# Patient Record
Sex: Male | Born: 1995 | Race: White | Hispanic: No | Marital: Single | Smoking: Current some day smoker
Health system: Southern US, Community
[De-identification: ages and names within clinical notes are randomized; demographics above are authoritative.]

## PROBLEM LIST (undated history)

## (undated) DIAGNOSIS — F988 Other specified behavioral and emotional disorders with onset usually occurring in childhood and adolescence: Secondary | ICD-10-CM

---
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
## 2020-09-30 ENCOUNTER — Other Ambulatory Visit (HOSPITAL_BASED_OUTPATIENT_CLINIC_OR_DEPARTMENT_OTHER): Payer: Self-pay | Admitting: Emergency Medicine

## 2020-09-30 ENCOUNTER — Emergency Department (HOSPITAL_BASED_OUTPATIENT_CLINIC_OR_DEPARTMENT_OTHER): Payer: Self-pay

## 2020-09-30 ENCOUNTER — Encounter (HOSPITAL_BASED_OUTPATIENT_CLINIC_OR_DEPARTMENT_OTHER): Payer: Self-pay

## 2020-09-30 ENCOUNTER — Emergency Department (HOSPITAL_BASED_OUTPATIENT_CLINIC_OR_DEPARTMENT_OTHER)
Admission: EM | Admit: 2020-09-30 | Discharge: 2020-09-30 | Disposition: A | Discharge status: Home / Self Care | Payer: Self-pay | Attending: Emergency Medicine | Admitting: Emergency Medicine

## 2020-09-30 ENCOUNTER — Other Ambulatory Visit: Payer: Self-pay

## 2020-09-30 DIAGNOSIS — F17200 Nicotine dependence, unspecified, uncomplicated: Secondary | ICD-10-CM | POA: Insufficient documentation

## 2020-09-30 DIAGNOSIS — X501XXA Overexertion from prolonged static or awkward postures, initial encounter: Secondary | ICD-10-CM | POA: Insufficient documentation

## 2020-09-30 DIAGNOSIS — Y92511 Restaurant or cafe as the place of occurrence of the external cause: Secondary | ICD-10-CM | POA: Insufficient documentation

## 2020-09-30 DIAGNOSIS — M545 Low back pain, unspecified: Secondary | ICD-10-CM

## 2020-09-30 MED ORDER — METHOCARBAMOL 500 MG PO TABS: 500.0000 mg | ORAL_TABLET | Freq: Three times a day (TID) | ORAL | 0 refills | Status: DC | PRN

## 2020-09-30 MED ORDER — IBUPROFEN 800 MG PO TABS: 800.0000 mg | ORAL_TABLET | Freq: Three times a day (TID) | ORAL | 0 refills | Status: DC | PRN

## 2020-09-30 MED FILL — METHOCARBAMOL 500 MG TABS: 500 | 7 days supply | Qty: 20 | Fill #0

## 2020-09-30 MED FILL — IBUPROFEN 800 MG TAB: 800 | 7 days supply | Qty: 21 | Fill #0

## 2020-09-30 NOTE — ED Triage Notes (Signed)
Pt reports lower back pain x 1.5 weeks

## 2020-09-30 NOTE — Discharge Instructions (Signed)
You were seen in the emergency department for evaluation of low back pain.  You had an x-ray that did not show any significant abnormalities.  This is likely muscular and usually will respond to anti-inflammatories and muscle relaxants.  You can continue to use heat as needed along with some gentle stretching.  Follow-up with your doctor.  Return to the emergency department for any worsening or concerning symptoms.

## 2020-09-30 NOTE — ED Provider Notes (Signed)
[facility] EMERGENCY DEPARTMENT Provider Note   CSN: [id number] Arrival date & time: 09/30/20  6962     History Chief Complaint  Patient presents with  . Back Pain    Billy Mathis is a 24 y.o. male.  He has no significant past medical history.  He said he works at [employer] and does a lot of lifting and twisting.  His low back has been hurting sharp stabbing for 1-1/2 weeks.  Tried some ibuprofen without improvement.  Using heating pad.  No radiation down the legs no fever no IV drug use no numbness no weakness.  No abdominal pain or urinary symptoms.  The history is provided by the patient.  Back Pain Location:  Lumbar spine Quality:  Stabbing Radiates to:  Does not radiate Pain severity:  Moderate Onset quality:  Gradual Duration:  2 weeks Timing:  Constant Progression:  Unchanged Chronicity:  New Context: occupational injury   Relieved by:  Nothing Worsened by:  Bending and twisting Ineffective treatments:  Heating pad and ibuprofen Associated symptoms: no abdominal pain, no bladder incontinence, no bowel incontinence, no dysuria, no fever, no numbness and no weakness        History reviewed. No pertinent past medical history.  There are no problems to display for this patient.   History reviewed. No pertinent surgical history.     History reviewed. No pertinent family history.  Social History   Tobacco Use  . Smoking status: Current Some Day Smoker  . Smokeless tobacco: Never Used  Substance Use Topics  . Alcohol use: Not Currently  . Drug use: Not Currently    Home Medications Prior to Admission medications   Not on File    Allergies    Shellfish allergy  Review of Systems   Review of Systems  Constitutional: Negative for fever.  Gastrointestinal: Negative for abdominal pain and bowel incontinence.  Genitourinary: Negative for bladder incontinence and dysuria.  Musculoskeletal: Positive for back pain.  Skin: Negative for rash.    Neurological: Negative for weakness and numbness.    Physical Exam Updated Vital Signs BP (!) 145/102 (BP Location: Right Arm)   Pulse 62   Temp 98 F (36.7 C) (Oral)   Resp 18   Ht 5\' 7"  (1.702 m)   Wt 65.8 kg   SpO2 99%   BMI 22.71 kg/m   Physical Exam Vitals and nursing note reviewed.  Constitutional:      Appearance: Normal appearance. He is well-developed.  HENT:     Head: Normocephalic and atraumatic.  Eyes:     Conjunctiva/sclera: Conjunctivae normal.  Pulmonary:     Effort: Pulmonary effort is normal.  Abdominal:     General: Abdomen is flat.     Tenderness: There is no guarding or rebound.  Musculoskeletal:        General: Tenderness present. No deformity.     Cervical back: Neck supple.     Right lower leg: No edema.     Left lower leg: No edema.     Comments: He has diffuse tenderness lumbar and paralumbar spine.  No step-offs.  No overlying skin changes.  Skin:    General: Skin is warm and dry.     Capillary Refill: Capillary refill takes less than 2 seconds.  Neurological:     General: No focal deficit present.     Mental Status: He is alert.     GCS: GCS eye subscore is 4. GCS verbal subscore is 5. GCS motor subscore  is 6.     Sensory: No sensory deficit.     Motor: No weakness.     Gait: Gait normal.     ED Results / Procedures / Treatments   Labs (all labs ordered are listed, but only abnormal results are displayed) Labs Reviewed - No data to display  EKG None  Radiology DG Lumbar Spine Complete  Result Date: 09/30/2020 CLINICAL DATA:  Low back pain for 1.5 weeks, no trauma EXAM: LUMBAR SPINE - COMPLETE 4+ VIEW COMPARISON:  None. FINDINGS: There is no evidence of lumbar spine fracture. Alignment is normal. Intervertebral disc spaces are maintained. IMPRESSION: No fracture or dislocation of the lumbar spine. Disc spaces and vertebral body heights are preserved. Lumbar disc and neural foraminal pathology may be further evaluated by MRI if  indicated by neurologically localizing signs and symptoms. Electronically Signed   By: Lauralyn Primes M.D.   On: 09/30/2020 10:22    Procedures Procedures (including critical care time)  Medications Ordered in ED Medications - No data to display  ED Course  I have reviewed the triage vital signs and the nursing notes.  Pertinent labs & imaging results that were available during my care of the patient were reviewed by me and considered in my medical decision making (see chart for details).  Clinical Course as of Sep 30 1716  Mon Sep 30, 2020  1024 X-ray does not show any obvious fracture or dislocation, has normal alignment no arthritic changes.   [MB]    Clinical Course User Index [MB] Terrilee Files, MD  Differential diagnosis includes musculoskeletal strain, disc, less likely fracture, arthritis MDM Rules/Calculators/A&P                         Patient with no red flags on hx or exam. They understand the need for further evaluation with their primary care provider and symptoms to watch out for to return to emergency department.   Final Clinical Impression(s) / ED Diagnoses Final diagnoses:  Acute bilateral low back pain without sciatica    Rx / DC Orders ED Discharge Orders         Ordered    ibuprofen (ADVIL) 800 MG tablet  Every 8 hours PRN        09/30/20 1025    methocarbamol (ROBAXIN) 500 MG tablet  Every 8 hours PRN        09/30/20 1025           Terrilee Files, MD 09/30/20 1717

## 2021-02-18 ENCOUNTER — Emergency Department (HOSPITAL_BASED_OUTPATIENT_CLINIC_OR_DEPARTMENT_OTHER)
Admission: EM | Admit: 2021-02-18 | Discharge: 2021-02-18 | Disposition: A | Discharge status: Home / Self Care | Payer: Self-pay | Attending: Emergency Medicine | Admitting: Emergency Medicine

## 2021-02-18 ENCOUNTER — Encounter (HOSPITAL_BASED_OUTPATIENT_CLINIC_OR_DEPARTMENT_OTHER): Payer: Self-pay | Admitting: Emergency Medicine

## 2021-02-18 DIAGNOSIS — F419 Anxiety disorder, unspecified: Secondary | ICD-10-CM | POA: Insufficient documentation

## 2021-02-18 DIAGNOSIS — F1910 Other psychoactive substance abuse, uncomplicated: Secondary | ICD-10-CM | POA: Insufficient documentation

## 2021-02-18 DIAGNOSIS — F17200 Nicotine dependence, unspecified, uncomplicated: Secondary | ICD-10-CM | POA: Insufficient documentation

## 2021-02-18 MED ORDER — HYDROXYZINE HCL 25 MG PO TABS: 25.0000 mg | ORAL_TABLET | Freq: Four times a day (QID) | ORAL | 0 refills | Status: DC

## 2021-02-18 MED ORDER — HYDROXYZINE HCL 25 MG PO TABS
25.0000 mg | ORAL_TABLET | Freq: Once | ORAL | Status: AC
  Administered 2021-02-18: 25 mg via ORAL
  Filled 2021-02-18: qty 1

## 2021-02-18 NOTE — ED Provider Notes (Signed)
[facility] EMERGENCY DEPARTMENT Provider Note   CSN: [id number] Arrival date & time: 02/18/21  8182     History Chief Complaint  Patient presents with  . Anxiety    Billy Mathis is a 25 y.o. male.  Patient admits to abusing marijuana, benzos.  Feeling anxious today.   Denies suicidal homicidal ideation.  The history is provided by the patient.  Anxiety This is a new problem. The problem occurs constantly. Pertinent negatives include no chest pain, no abdominal pain and no shortness of breath. Nothing aggravates the symptoms. Nothing relieves the symptoms. He has tried nothing for the symptoms. The treatment provided no relief.       History reviewed. No pertinent past medical history.  There are no problems to display for this patient.   History reviewed. No pertinent surgical history.     No family history on file.  Social History   Tobacco Use  . Smoking status: Current Some Day Smoker  . Smokeless tobacco: Never Used  Vaping Use  . Vaping Use: Some days  . Devices: THC  Substance Use Topics  . Alcohol use: Not Currently    Home Medications Prior to Admission medications   Medication Sig Start Date End Date Taking? Authorizing Provider  hydrOXYzine (ATARAX/VISTARIL) 25 MG tablet Take 1 tablet (25 mg total) by mouth every 6 (six) hours. 02/18/21  Yes Fedra Lanter, DO  ibuprofen (ADVIL) 800 MG tablet Take 1 tablet (800 mg total) by mouth every 8 (eight) hours as needed. 09/30/20   Terrilee Files, MD  methocarbamol (ROBAXIN) 500 MG tablet Take 1 tablet (500 mg total) by mouth every 8 (eight) hours as needed for muscle spasms. 09/30/20   Terrilee Files, MD    Allergies    Shellfish allergy  Review of Systems   Review of Systems  Constitutional: Negative for chills and fever.  HENT: Negative for ear pain and sore throat.   Eyes: Negative for pain and visual disturbance.  Respiratory: Negative for cough and shortness of breath.    Cardiovascular: Negative for chest pain and palpitations.  Gastrointestinal: Negative for abdominal pain and vomiting.  Genitourinary: Negative for dysuria and hematuria.  Musculoskeletal: Negative for arthralgias and back pain.  Skin: Negative for color change and rash.  Neurological: Negative for seizures and syncope.  Psychiatric/Behavioral: The patient is nervous/anxious.   All other systems reviewed and are negative.   Physical Exam Updated Vital Signs BP 124/85 (BP Location: Right Arm)   Pulse 68   Temp 97.7 F (36.5 C) (Oral)   Resp 18   Ht 5\' 7"  (1.702 m)   Wt 69 kg   SpO2 100%   BMI 23.82 kg/m   Physical Exam Vitals and nursing note reviewed.  Constitutional:      Appearance: He is well-developed.  HENT:     Head: Normocephalic and atraumatic.  Eyes:     Extraocular Movements: Extraocular movements intact.     Conjunctiva/sclera: Conjunctivae normal.     Pupils: Pupils are equal, round, and reactive to light.  Cardiovascular:     Rate and Rhythm: Normal rate and regular rhythm.     Pulses: Normal pulses.     Heart sounds: No murmur heard.   Pulmonary:     Effort: Pulmonary effort is normal. No respiratory distress.     Breath sounds: Normal breath sounds.  Abdominal:     Palpations: Abdomen is soft.     Tenderness: There is no abdominal tenderness.  Musculoskeletal:  Cervical back: Neck supple.  Skin:    General: Skin is warm and dry.  Neurological:     General: No focal deficit present.     Mental Status: He is alert and oriented to person, place, and time.     Cranial Nerves: No cranial nerve deficit.     Sensory: No sensory deficit.     Motor: No weakness.     Coordination: Coordination normal.     ED Results / Procedures / Treatments   Labs (all labs ordered are listed, but only abnormal results are displayed) Labs Reviewed - No data to display  EKG None  Radiology No results found.  Procedures Procedures   Medications Ordered  in ED Medications  hydrOXYzine (ATARAX/VISTARIL) tablet 25 mg (25 mg Oral Given 02/18/21 2353)    ED Course  I have reviewed the triage vital signs and the nursing notes.  Pertinent labs & imaging results that were available during my care of the patient were reviewed by me and considered in my medical decision making (see chart for details).    MDM Rules/Calculators/A&P                          Billy Mathis is here with anxiety after using drugs.  Normal vitals.  No fever.  Not suicidal homicidal.  Abusing THC, benzodiazepines.  Overall appears well.  Given Vistaril.  Neurologically he is intact.  Educated about drug use.  Discharged in good condition.  This chart was dictated using voice recognition software.  Despite best efforts to proofread,  errors can occur which can change the documentation meaning.    Final Clinical Impression(s) / ED Diagnoses Final diagnoses:  Anxiety  Polysubstance abuse (HCC)    Rx / DC Orders ED Discharge Orders         Ordered    hydrOXYzine (ATARAX/VISTARIL) 25 MG tablet  Every 6 hours        02/18/21 0745           Virgina Norfolk, DO 02/18/21 1028

## 2021-02-18 NOTE — ED Triage Notes (Signed)
Pt brought in by EMS  States he has been feeling anxious for a couple days now  Pt states he has been using drug he usually does not use  Pt states he smokes weed frequently Pt states he had one episode of nausea and vomiting this morning when he woke up

## 2021-02-18 NOTE — ED Triage Notes (Signed)
Brought by ems from home.   Appears very anxious in triage.  Reports after using a THC vape pen last night felt sob, vomited, and passed out.  Also endorses using adderal, cocaine, and xanax recently.  Reports last dose a few days ago.

## 2021-02-18 NOTE — Discharge Instructions (Addendum)
Follow-up with outpatient psychiatric counseling as well as consider going to rehab.  Is important for you to stop using drugs as this is causing you to have your anxious feelings.  Please return if you develop any worsening feelings of depression that are leading you to want to hurt yourself or hurt others.

## 2022-01-20 ENCOUNTER — Encounter (HOSPITAL_BASED_OUTPATIENT_CLINIC_OR_DEPARTMENT_OTHER): Payer: Self-pay

## 2022-01-20 ENCOUNTER — Other Ambulatory Visit: Payer: Self-pay

## 2022-01-20 ENCOUNTER — Emergency Department (HOSPITAL_BASED_OUTPATIENT_CLINIC_OR_DEPARTMENT_OTHER)
Admission: EM | Admit: 2022-01-20 | Discharge: 2022-01-20 | Disposition: A | Discharge status: Home / Self Care | Payer: Self-pay | Attending: Emergency Medicine | Admitting: Emergency Medicine

## 2022-01-20 DIAGNOSIS — M5432 Sciatica, left side: Secondary | ICD-10-CM | POA: Insufficient documentation

## 2022-01-20 DIAGNOSIS — X500XXA Overexertion from strenuous movement or load, initial encounter: Secondary | ICD-10-CM | POA: Insufficient documentation

## 2022-01-20 HISTORY — DX: Other specified behavioral and emotional disorders with onset usually occurring in childhood and adolescence: F98.8

## 2022-01-20 MED ORDER — KETOROLAC TROMETHAMINE 60 MG/2ML IM SOLN
60.0000 mg | Freq: Once | INTRAMUSCULAR | Status: AC
  Administered 2022-01-20: 60 mg via INTRAMUSCULAR
  Filled 2022-01-20: qty 2

## 2022-01-20 MED ORDER — DEXAMETHASONE SODIUM PHOSPHATE 10 MG/ML IJ SOLN
10.0000 mg | Freq: Once | INTRAMUSCULAR | Status: AC
  Administered 2022-01-20: 10 mg via INTRAMUSCULAR
  Filled 2022-01-20: qty 1

## 2022-01-20 MED ORDER — METHYLPREDNISOLONE 4 MG PO TBPK: ORAL_TABLET | ORAL | 0 refills | Status: AC

## 2022-01-20 MED ORDER — CYCLOBENZAPRINE HCL 10 MG PO TABS: 5.0000 mg | ORAL_TABLET | Freq: Two times a day (BID) | ORAL | 0 refills | Status: AC | PRN

## 2022-01-20 MED ORDER — CELECOXIB 200 MG PO CAPS: 200.0000 mg | ORAL_CAPSULE | Freq: Two times a day (BID) | ORAL | 0 refills | Status: AC

## 2022-01-20 NOTE — ED Triage Notes (Signed)
Pt reports lower back pain that radiates down his left leg, onset 1 week ago when he woke up.

## 2022-01-20 NOTE — ED Notes (Signed)
Pt in bed, pt reports decreased pain, pt states that he is ready to go home, pt verbalized understanding d/c instructions and follow up,ambulatory from dpt with sig other.

## 2022-01-20 NOTE — ED Provider Notes (Signed)
[facility] EMERGENCY DEPARTMENT Provider Note   CSN: [id number] Arrival date & time: 01/20/22  [id number]     History  Chief Complaint  Patient presents with   Back Pain    Billy Mathis is a 26 y.o. male.   Back Pain Location:  Lumbar spine Quality:  Aching Radiates to:  L posterior upper leg Pain severity:  Severe Pain is:  Same all the time Onset quality:  Gradual Duration:  1 week Timing:  Constant Progression:  Unchanged Chronicity:  New Context: lifting heavy objects   Relieved by:  Nothing Worsened by:  Bending and sitting Associated symptoms: leg pain   Associated symptoms: no abdominal pain, no abdominal swelling, no bladder incontinence, no bowel incontinence, no chest pain, no dysuria, no fever, no headaches, no numbness, no paresthesias, no pelvic pain, no perianal numbness, no tingling and no weakness       Home Medications Prior to Admission medications   Medication Sig Start Date End Date Taking? Authorizing Provider  celecoxib (CELEBREX) 200 MG capsule Take 1 capsule (200 mg total) by mouth 2 (two) times daily. 01/20/22  Yes Evlyn Amason, PA-C  cyclobenzaprine (FLEXERIL) 10 MG tablet Take 0.5-1 tablets (5-10 mg total) by mouth 2 (two) times daily as needed for muscle spasms. 01/20/22  Yes Margarita Mail, PA-C  methylPREDNISolone (MEDROL DOSEPAK) 4 MG TBPK tablet Use as directed 01/20/22  Yes Natoria Archibald, PA-C  hydrOXYzine (ATARAX/VISTARIL) 25 MG tablet Take 1 tablet (25 mg total) by mouth every 6 (six) hours. 02/18/21   Curatolo, Adam, DO      Allergies    Shellfish allergy    Review of Systems   Review of Systems  Constitutional:  Negative for fever.  Cardiovascular:  Negative for chest pain.  Gastrointestinal:  Negative for abdominal pain and bowel incontinence.  Genitourinary:  Negative for bladder incontinence, dysuria and pelvic pain.  Musculoskeletal:  Positive for back pain.  Neurological:  Negative for tingling, weakness, numbness,  headaches and paresthesias.   Physical Exam Updated Vital Signs BP (!) 142/89 (BP Location: Right Arm)    Pulse (!) 107    Temp 98.5 F (36.9 C) (Oral)    Resp 18    Ht 5\' 7"  (1.702 m)    Wt 55.7 kg    SpO2 97%    BMI 19.23 kg/m  Physical Exam Vitals and nursing note reviewed.  Constitutional:      General: He is not in acute distress.    Appearance: He is well-developed. He is not diaphoretic.  HENT:     Head: Normocephalic and atraumatic.  Eyes:     General: No scleral icterus.    Conjunctiva/sclera: Conjunctivae normal.  Cardiovascular:     Rate and Rhythm: Normal rate and regular rhythm.     Heart sounds: Normal heart sounds.  Pulmonary:     Effort: Pulmonary effort is normal. No respiratory distress.     Breath sounds: Normal breath sounds.  Abdominal:     Palpations: Abdomen is soft.     Tenderness: There is no abdominal tenderness.  Musculoskeletal:     Cervical back: Normal range of motion and neck supple.     Comments: Patient appears to be in mild to moderate pain, antalgic gait noted. Lumbosacral spine area reveals no local tenderness or mass. Painful and reduced LS ROM noted. Straight leg raise is positive at 50 degrees on left. DTR's, motor strength and sensation normal, including heel and toe gait.  Peripheral pulses are palpable.  Skin:    General: Skin is warm and dry.  Neurological:     Mental Status: He is alert.  Psychiatric:        Behavior: Behavior normal.    ED Results / Procedures / Treatments   Labs (all labs ordered are listed, but only abnormal results are displayed) Labs Reviewed - No data to display  EKG None  Radiology No results found.  Procedures Procedures    Medications Ordered in ED Medications  dexamethasone (DECADRON) injection 10 mg (has no administration in time range)  ketorolac (TORADOL) injection 60 mg (has no administration in time range)    ED Course/ Medical Decision Making/ A&P                            Medical Decision Making Patient with back pain. + straight leg test. Recent heavy lifting. Consistent with dx Left Sciatica. Tx w/ decadron and toradol. No neurological deficits and normal neuro exam.  Patient can walk but states is painful.  No loss of bowel or bladder control.  No concern for cauda equina.  No fever, night sweats, weight loss, h/o cancer, IVDU.  RICE protocol and pain medicine indicated and discussed with patient.    Problems Addressed: Sciatica of left side: acute illness or injury  Risk Prescription drug management.    Final Clinical Impression(s) / ED Diagnoses Final diagnoses:  Sciatica of left side    Rx / DC Orders ED Discharge Orders          Ordered    methylPREDNISolone (MEDROL DOSEPAK) 4 MG TBPK tablet        01/20/22 1724    celecoxib (CELEBREX) 200 MG capsule  2 times daily        01/20/22 1724    cyclobenzaprine (FLEXERIL) 10 MG tablet  2 times daily PRN        01/20/22 1724              Margarita Mail, PA-C 01/20/22 1725    Gareth Morgan, MD 01/21/22 1446

## 2022-01-20 NOTE — Discharge Instructions (Signed)
Get help right away if: You are not able to control when you urinate or have bowel movements (incontinence). You have: Weakness in your lower back, pelvis, buttocks, or legs that gets worse. Redness or swelling of your back. A burning sensation when you urinate. 

## 2022-06-01 IMAGING — CR DG LUMBAR SPINE COMPLETE 4+V
5 series · 5 of 5 positions shown · non-contrast
Comparison: None.

CLINICAL DATA: Low back pain for 1.5 weeks, no trauma

EXAM:
LUMBAR SPINE - COMPLETE 4+ VIEW

[t l-spine a.p.]
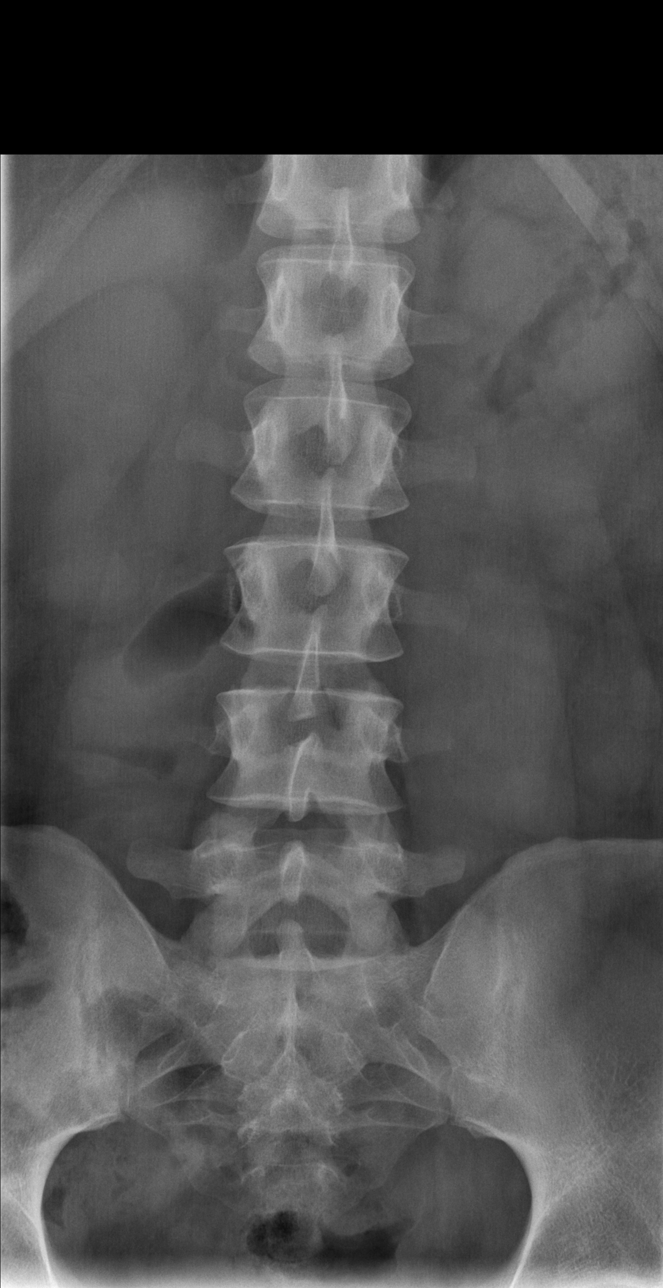

[t l-spine oblique exposure (1 of 2)]
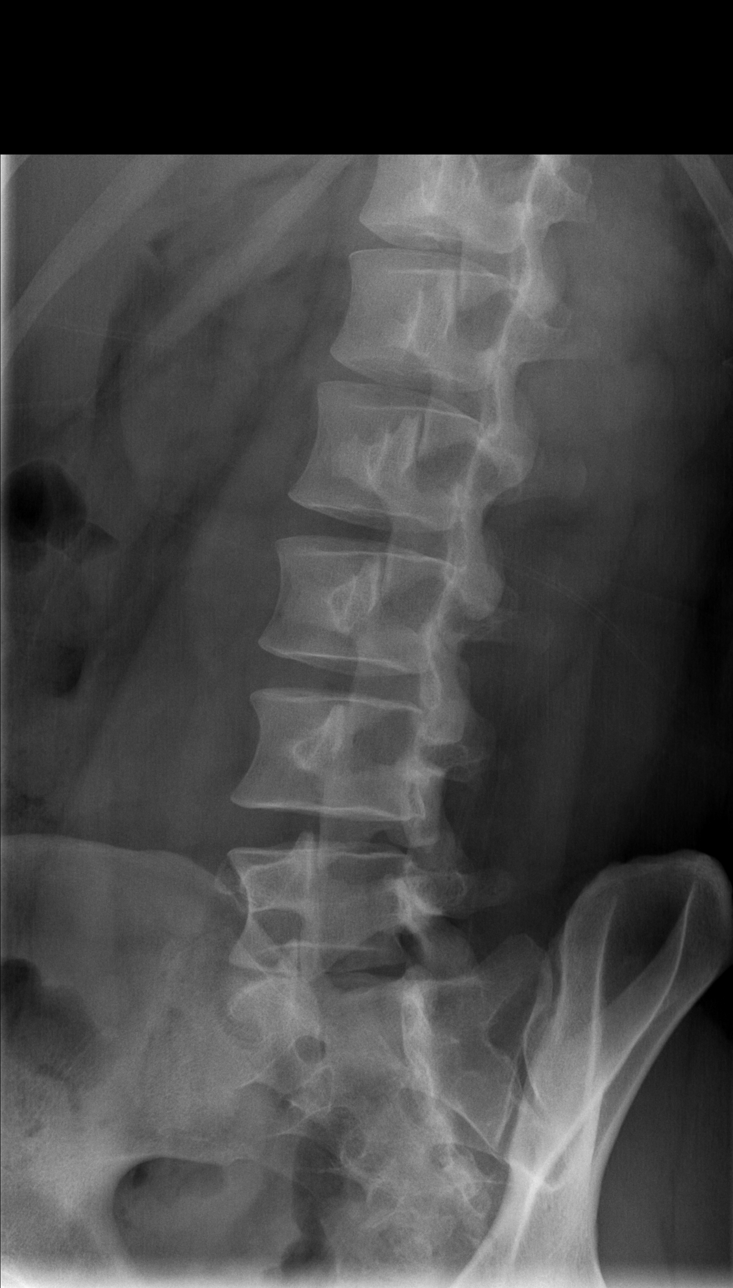

[t l-spine oblique exposure (2 of 2)]
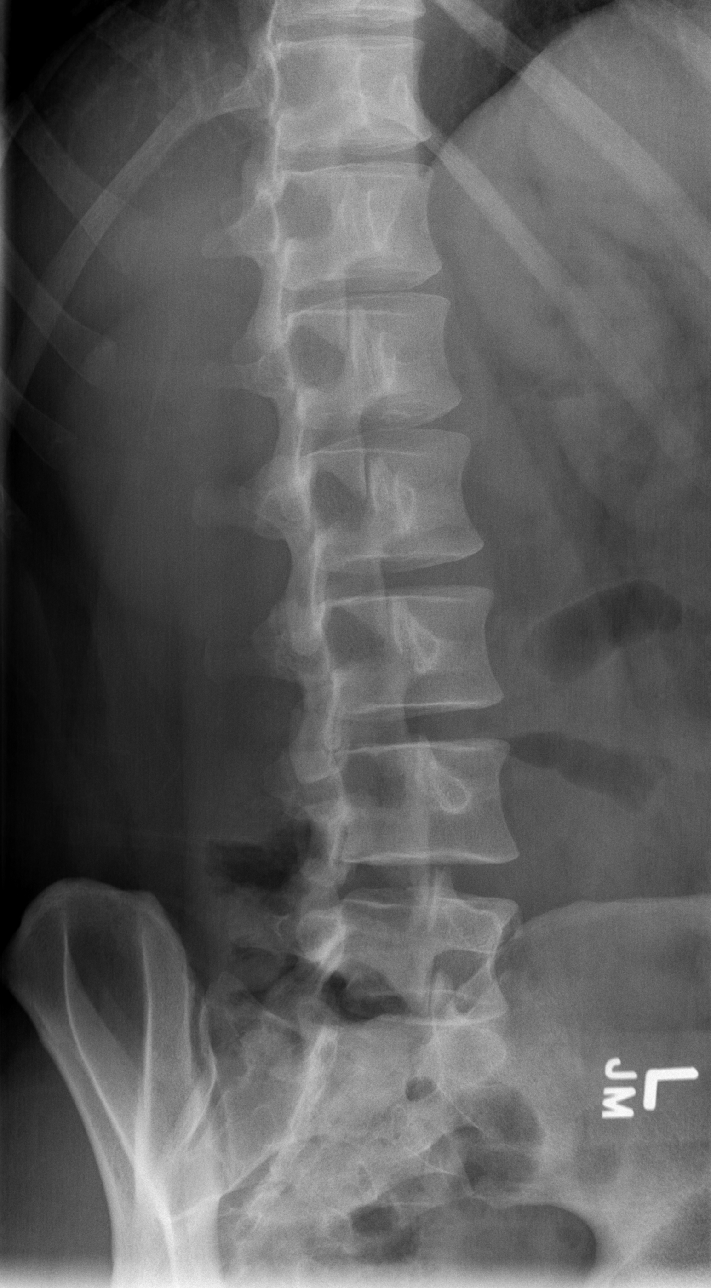

[t l-spine lat]
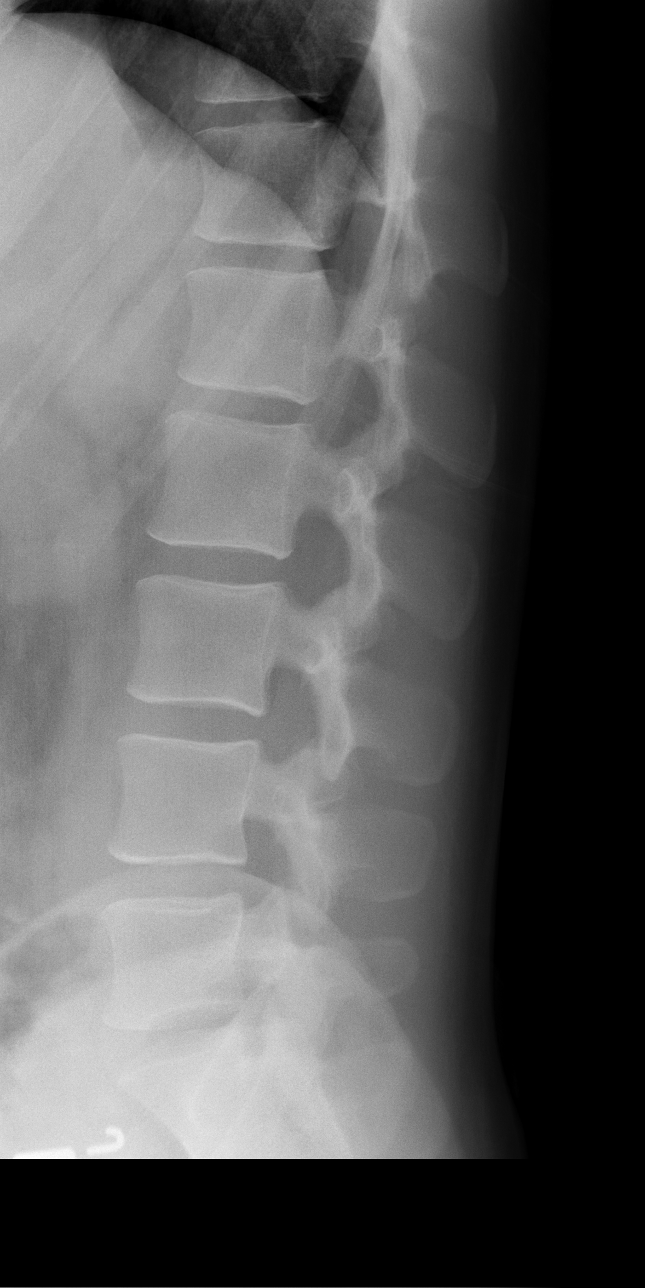

[t l-spine l5-s1 spot]
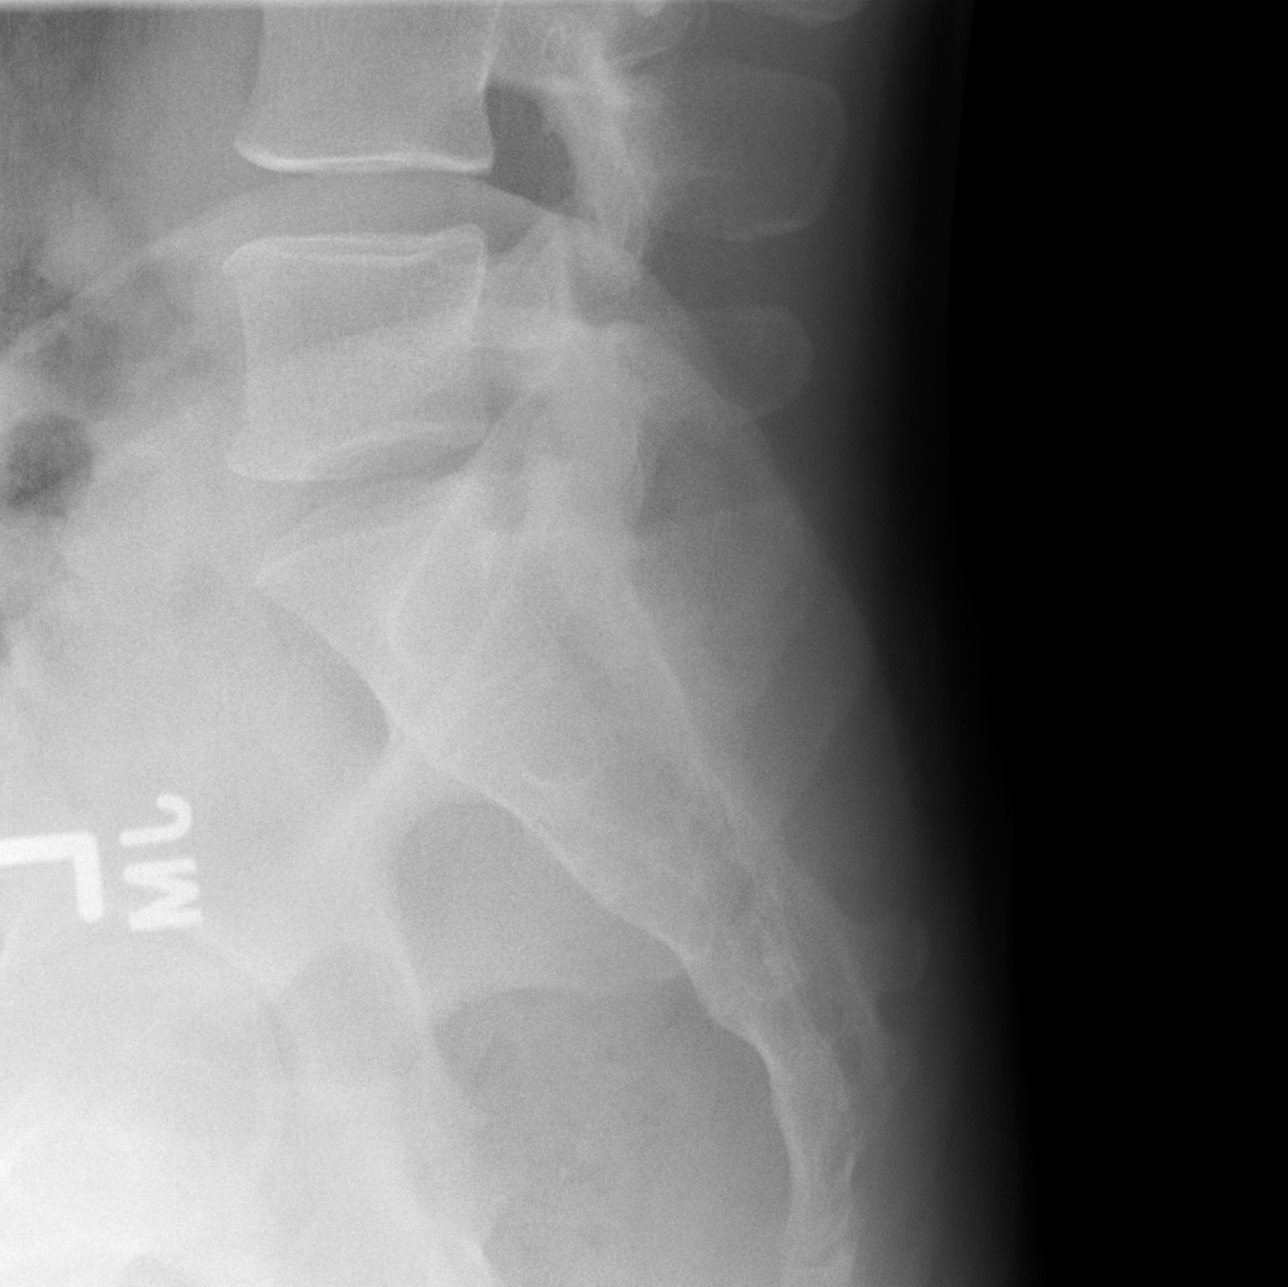

[5 of 5 positions shown; findings below may reference images not displayed]

FINDINGS: There is no evidence of lumbar spine fracture. Alignment is normal.
Intervertebral disc spaces are maintained.
IMPRESSION: No fracture or dislocation of the lumbar spine. Disc spaces and
vertebral body heights are preserved. Lumbar disc and neural
foraminal pathology may be further evaluated by MRI if indicated by
neurologically localizing signs and symptoms.

## 2024-03-01 ENCOUNTER — Emergency Department (HOSPITAL_COMMUNITY)
Admission: EM | Admit: 2024-03-01 | Discharge: 2024-03-01 | Discharge status: Against Medical Advice | Payer: MEDICAID | Attending: Emergency Medicine | Admitting: Emergency Medicine

## 2024-03-01 ENCOUNTER — Encounter (HOSPITAL_COMMUNITY): Payer: Self-pay

## 2024-03-01 ENCOUNTER — Other Ambulatory Visit: Payer: Self-pay

## 2024-03-01 DIAGNOSIS — Z5329 Procedure and treatment not carried out because of patient's decision for other reasons: Secondary | ICD-10-CM | POA: Diagnosis not present

## 2024-03-01 DIAGNOSIS — E162 Hypoglycemia, unspecified: Secondary | ICD-10-CM | POA: Insufficient documentation

## 2024-03-01 DIAGNOSIS — R112 Nausea with vomiting, unspecified: Secondary | ICD-10-CM | POA: Insufficient documentation

## 2024-03-01 DIAGNOSIS — R1084 Generalized abdominal pain: Secondary | ICD-10-CM | POA: Diagnosis not present

## 2024-03-01 LAB — CBC
HCT: 54.9 % — ABNORMAL HIGH (ref 39.0–52.0)
Hemoglobin: 18.6 g/dL — ABNORMAL HIGH (ref 13.0–17.0)
MCH: 29.8 pg (ref 26.0–34.0)
MCHC: 33.9 g/dL (ref 30.0–36.0)
MCV: 88 fL (ref 80.0–100.0)
Platelets: 254 10*3/uL (ref 150–400)
RBC: 6.24 MIL/uL — ABNORMAL HIGH (ref 4.22–5.81)
RDW: 12.7 % (ref 11.5–15.5)
WBC: 13.8 10*3/uL — ABNORMAL HIGH (ref 4.0–10.5)
nRBC: 0 % (ref 0.0–0.2)

## 2024-03-01 LAB — URINALYSIS, ROUTINE W REFLEX MICROSCOPIC
Bacteria, UA: NONE SEEN
Bilirubin Urine: NEGATIVE
Glucose, UA: >=500 mg/dL — AB
Hgb urine dipstick: NEGATIVE
Ketones, ur: 80 mg/dL — AB
Leukocytes,Ua: NEGATIVE
Nitrite: NEGATIVE
Protein, ur: NEGATIVE mg/dL
Specific Gravity, Urine: 1.024 (ref 1.005–1.030)
pH: 5 (ref 5.0–8.0)

## 2024-03-01 LAB — CBG MONITORING, ED
Glucose-Capillary: 61 mg/dL — ABNORMAL LOW (ref 70–99)
Glucose-Capillary: 46 mg/dL — ABNORMAL LOW (ref 70–99)
Glucose-Capillary: 206 mg/dL — ABNORMAL HIGH (ref 70–99)
Glucose-Capillary: 81 mg/dL (ref 70–99)

## 2024-03-01 LAB — COMPREHENSIVE METABOLIC PANEL WITH GFR
ALT: 36 U/L (ref 0–44)
AST: 34 U/L (ref 15–41)
Albumin: 5.6 g/dL — ABNORMAL HIGH (ref 3.5–5.0)
Alkaline Phosphatase: 64 U/L (ref 38–126)
Anion gap: 15 (ref 5–15)
BUN: 20 mg/dL (ref 6–20)
CO2: 23 mmol/L (ref 22–32)
Calcium: 9.9 mg/dL (ref 8.9–10.3)
Chloride: 102 mmol/L (ref 98–111)
Creatinine, Ser: 1.11 mg/dL (ref 0.61–1.24)
GFR, Estimated: >60 mL/min (ref >60)
Glucose, Bld: 89 mg/dL (ref 70–99)
Potassium: 4.7 mmol/L (ref 3.5–5.1)
Sodium: 140 mmol/L (ref 135–145)
Total Bilirubin: 1.9 mg/dL — ABNORMAL HIGH (ref 0.0–1.2)
Total Protein: 8.8 g/dL — ABNORMAL HIGH (ref 6.5–8.1)

## 2024-03-01 LAB — RAPID URINE DRUG SCREEN, HOSP PERFORMED
Amphetamines: NOT DETECTED
Barbiturates: NOT DETECTED
Benzodiazepines: NOT DETECTED
Cocaine: NOT DETECTED
Opiates: NOT DETECTED
Tetrahydrocannabinol: POSITIVE — AB

## 2024-03-01 LAB — LIPASE, BLOOD: Lipase: 23 U/L (ref 11–51)

## 2024-03-01 MED ORDER — SODIUM CHLORIDE 0.9 % IV SOLN: 250.0000 mL | INTRAVENOUS | Status: DC | PRN

## 2024-03-01 MED ORDER — GLUCOSE 40 % PO GEL
1.0000 | Freq: Once | ORAL | Status: AC
  Administered 2024-03-01: 31 g via ORAL
  Filled 2024-03-01: qty 1

## 2024-03-01 MED ORDER — GLUCOSE 4 G PO CHEW
CHEWABLE_TABLET | ORAL | Status: AC
  Filled 2024-03-01: qty 1

## 2024-03-01 MED ORDER — SODIUM CHLORIDE 0.9 % IV BOLUS: 500.0000 mL | Freq: Once | INTRAVENOUS | Status: DC

## 2024-03-01 MED ORDER — DEXTROSE 50 % IV SOLN
INTRAVENOUS | Status: AC
  Administered 2024-03-01: 1 via INTRAVENOUS
  Filled 2024-03-01: qty 50

## 2024-03-01 MED ORDER — SODIUM CHLORIDE 0.9% FLUSH: 3.0000 mL | INTRAVENOUS | Status: DC | PRN

## 2024-03-01 MED ORDER — ONDANSETRON HCL 4 MG/2ML IJ SOLN: 4.0000 mg | Freq: Once | INTRAMUSCULAR | Status: DC

## 2024-03-01 MED ORDER — SODIUM CHLORIDE 0.9% FLUSH
3.0000 mL | Freq: Two times a day (BID) | INTRAVENOUS | Status: DC
  Administered 2024-03-01: 3 mL via INTRAVENOUS

## 2024-03-01 MED ORDER — ONDANSETRON HCL 4 MG PO TABS
4.0000 mg | ORAL_TABLET | Freq: Four times a day (QID) | ORAL | 0 refills | Status: AC
  Filled 2024-03-01: qty 12, 3d supply, fill #0

## 2024-03-01 NOTE — ED Notes (Signed)
 Attempt to draw labs on patient. Pt is refusing at this time. States he needs time to think about it. Pt also refusing IM Glucagon at this time. PA brooke ask to evaluate, agrees. At bedside at this time.

## 2024-03-01 NOTE — ED Notes (Signed)
 Pt given orange juice.

## 2024-03-01 NOTE — ED Provider Triage Note (Cosign Needed)
 Emergency Medicine Provider Triage Evaluation Note  Billy Mathis , a 28 y.o. male  was evaluated in triage.  Pt complains of n/v and chills. C/o generalized abd pain no IVDU.   Review of Systems  Positive: Nausea, vomiting Negative: fevers  Physical Exam  BP 116/69 (BP Location: Left Arm)   Pulse 68   Temp 97.6 F (36.4 C) (Oral)   Resp 16   Ht 5\' 7"  (1.702 m)   Wt 55.3 kg   SpO2 98%   BMI 19.11 kg/m  Gen:   Awake, no distress  Resp:  Normal effort  MSK:   Moves extremities without difficulty  Other:    Medical Decision Making  Medically screening exam initiated at 1:36 PM.  Appropriate orders placed.  Hymen Arnett was informed that the remainder of the evaluation will be completed by another provider, this initial triage assessment does not replace that evaluation, and the importance of remaining in the ED until their evaluation is complete.     Pete Pelt, [place] 03/01/24 1400

## 2024-03-01 NOTE — ED Provider Notes (Cosign Needed)
 [place] EMERGENCY DEPARTMENT AT [facility] Provider Note   CSN: [id number] Arrival date & time: 03/01/24  1251     History  Chief Complaint  Patient presents with   Nausea    Billy Mathis is a 28 y.o. male, no pertinent past medical history, who presents to the ED secondary to nausea/vomiting starting today.  He states he has been vomiting and having some stomach pain, for the last day.  He states pain is all over, and he is requesting food.  States he cannot keep anything down though.  Notes that he has not had any fevers or chills but has had a runny nose.  Denies any sick contacts.  Denies any IV drug use, or alcohol use other than drinking 3 beers last night.  Does use PCP from time to time.  Home Medications Prior to Admission medications   Medication Sig Start Date End Date Taking? Authorizing Provider  celecoxib (CELEBREX) 200 MG capsule Take 1 capsule (200 mg total) by mouth 2 (two) times daily. 01/20/22   Arthor Captain, PA-C  cyclobenzaprine (FLEXERIL) 10 MG tablet Take 0.5-1 tablets (5-10 mg total) by mouth 2 (two) times daily as needed for muscle spasms. 01/20/22   Arthor Captain, PA-C  hydrOXYzine (ATARAX/VISTARIL) 25 MG tablet Take 1 tablet (25 mg total) by mouth every 6 (six) hours. 02/18/21   Virgina Norfolk, DO  methylPREDNISolone (MEDROL DOSEPAK) 4 MG TBPK tablet Use as directed 01/20/22   Arthor Captain, PA-C      Allergies    Shellfish allergy    Review of Systems   Review of Systems  Constitutional:  Negative for fever.  Gastrointestinal:  Positive for abdominal pain, nausea and vomiting.    Physical Exam Updated Vital Signs BP 116/69 (BP Location: Left Arm)   Pulse 68   Temp 97.6 F (36.4 C) (Oral)   Resp 16   Ht 5\' 7"  (1.702 m)   Wt 55.3 kg   SpO2 98%   BMI 19.11 kg/m  Physical Exam Vitals and nursing note reviewed.  Constitutional:      General: He is not in acute distress.    Appearance: He is well-developed.  HENT:     Head:  Normocephalic and atraumatic.  Eyes:     Conjunctiva/sclera: Conjunctivae normal.  Cardiovascular:     Rate and Rhythm: Normal rate and regular rhythm.     Heart sounds: No murmur heard. Pulmonary:     Effort: Pulmonary effort is normal. No respiratory distress.     Breath sounds: Normal breath sounds.  Abdominal:     Palpations: Abdomen is soft.     Tenderness: There is generalized abdominal tenderness. There is no guarding or rebound.  Musculoskeletal:        General: No swelling.     Cervical back: Neck supple.  Skin:    General: Skin is warm and dry.     Capillary Refill: Capillary refill takes less than 2 seconds.  Neurological:     Mental Status: He is alert.  Psychiatric:        Mood and Affect: Mood normal.     ED Results / Procedures / Treatments   Labs (all labs ordered are listed, but only abnormal results are displayed) Labs Reviewed  CBC - Abnormal; Notable for the following components:      Result Value   WBC 13.8 (*)    RBC 6.24 (*)    Hemoglobin 18.6 (*)    HCT 54.9 (*)  All other components within normal limits  CBG MONITORING, ED - Abnormal; Notable for the following components:   Glucose-Capillary 61 (*)    All other components within normal limits  CBG MONITORING, ED - Abnormal; Notable for the following components:   Glucose-Capillary 46 (*)    All other components within normal limits  CBG MONITORING, ED - Abnormal; Notable for the following components:   Glucose-Capillary 206 (*)    All other components within normal limits  RESP PANEL BY RT-PCR (RSV, FLU A&B, COVID)  RVPGX2  LIPASE, BLOOD  COMPREHENSIVE METABOLIC PANEL WITH GFR  URINALYSIS, ROUTINE W REFLEX MICROSCOPIC  RAPID URINE DRUG SCREEN, HOSP PERFORMED  CBG MONITORING, ED  CBG MONITORING, ED    EKG None  Radiology No results found.  Procedures Procedures    Medications Ordered in ED Medications  glucose 4 GM chewable tablet (  Not Given 03/01/24 1428)  sodium chloride  flush (NS) 0.9 % injection 3 mL (3 mLs Intravenous Given 03/01/24 1428)  sodium chloride flush (NS) 0.9 % injection 3 mL (has no administration in time range)  0.9 %  sodium chloride infusion (has no administration in time range)  dextrose (GLUTOSE) oral gel 40% (peds > 20kg and adults) (31 g Oral Given 03/01/24 1335)  dextrose 50 % solution (1 ampule Intravenous Given 03/01/24 1423)    ED Course/ Medical Decision Making/ A&P                                 Medical Decision Making I initially saw this patient in triage, he complains of nausea, vomiting, for the last day, with runny nose as well COVID/flu, lipase, urinalysis ordered, states he does not drink daily, but drinks 3 beers last night.  He was hypoglycemic in triage with a glucose of 61, I ordered oral glucose gel, as he refused an IV initially.  I was not alerted of this, but patient refused oral glucose gel, and was given juice instead.  He came back to the back, and was hypoglycemic even more.  At 41.  I ordered an amp of D50, and recheck in 15 minutes.  He is mentating well,'s tender generalized abdomen without any guarding.  Has not eaten or drank in a while he states.  Will obtain labs for further evaluation.  Continue to monitor sugar. Handed off to Metaline PA to follow-up on results  Amount and/or Complexity of Data Reviewed Labs: ordered.  Risk OTC drugs. Prescription drug management.    Final Clinical Impression(s) / ED Diagnoses Final diagnoses:  Hypoglycemia  Nausea and vomiting, unspecified vomiting type  Generalized abdominal pain    Rx / DC Orders ED Discharge Orders     None         Shamere Campas L, PA 03/01/24 1505

## 2024-03-01 NOTE — ED Provider Notes (Signed)
 Patient given in sign out by Small, PA-C.  Please review their note for patient HPI, physical exam, workup.  At this time the plan is to reevaluate after labs and if patient has persistent vomiting will need a CT scan.  Will need to be p.o. challenged.  I was called to the bedside as nurse notified me that patient wants leave AMA as we do not have all the labs back yet.  Patient is able to keep sandwiches and apple juice and without issue and states he feels home is better.  I explained to the patient that if he leaves this time he would be leaving AMA however patient states that he would like to stay until the rest of his labs are back.  Patient is requesting more apple juice.  I went to go reevaluate the patient and patient was vomiting again.  Given that the patient is continuously vomiting and his labs do show leukocytosis I did offer a CT scan as patient states he has abdominal pain right in his periumbilical region which would be concerning for possible appendicitis.  Patient did not have any tenderness to superficial or deep palpation on my exam or peritoneal signs.  Patient states that he wants to go home however after speaking with his ex/roommate patient is agreeable to stay to get some fluids and Zofran along with a CT scan.  Will recheck CBG however given that patient came in hypoglycemic will only start with 500 of fluids do not think it is trigger.  Patient wants to leave against medical advice. Patient understands that his/her actions will lead to inadequate medical workup, and that he/she is at risk of complications of missed diagnosis, which includes morbidity and mortality.  Alternative options discussed such as staying for fluids and CT imaging Opportunity to change mind given  Discussion witnessed by Sibyl Parr, RN Patient is demonstrating good capacity to make decision. Patient understands that he/she needs to return to the ER immediately if his/her symptoms get worse.  Will  send for Zofran.   Netta Corrigan, PA-C 03/01/24 1818    Margarita Grizzle, MD 03/02/24 [phone]

## 2024-03-01 NOTE — Discharge Instructions (Addendum)
 At your request you are being discharged AMA.  Please follow-up with your primary care provider and if there is change or worsen please return to the ER.  I have prescribed you Zofran help with any nausea.

## 2024-03-01 NOTE — ED Triage Notes (Signed)
 Pt arrived reporting he has been nauseous and dry heaving since yesterday. States had a few drinks last night. Denies any alcohol today. Also denies any recent drug use.States has some abdominal soreness. Denies any other symptoms.

## 2024-03-02 ENCOUNTER — Other Ambulatory Visit (HOSPITAL_BASED_OUTPATIENT_CLINIC_OR_DEPARTMENT_OTHER): Payer: Self-pay

## 2024-03-15 ENCOUNTER — Other Ambulatory Visit (HOSPITAL_BASED_OUTPATIENT_CLINIC_OR_DEPARTMENT_OTHER): Payer: Self-pay

## 2024-06-12 NOTE — Progress Notes (Deleted)
 Psychiatric Initial Adult Assessment  Patient Identification: Billy Mathis MRN:  [id number] Date of Evaluation:  06/12/2024  Assessment: ***  Plan:  # *** - ***  # *** - ***  # *** - ***  Patient was given contact information for behavioral health clinic and was instructed to call 911 for emergencies.   Identifying Information: Billy Mathis is a 28 y.o. male with a history of substance induced psychosis who presents in person to [facility] for initial evaluation. Subjective:  History of Present Illness:    Billy Mathis is a 28 y.o. male with a PPHx of substance induced psychosis who presents to the [facility] for ***. He is currently taking ***.   Patient seen ***.  Patient reports feeling *** today. Patient reports *** sleep, ***. Patient reports *** appetite, ***.   Patient rates anxiety a ***/10, depression a ***/10, and anger a ***/10.  Goals for care: ***  Stressors include ***.   Patient denies current SI, HI, and AVH. ***   I discussed the risks/benefits/possible adverse effects of starting ***.     Psychiatric ROS Mood Symptoms ***  Anxiety Symptoms ***  Manic Symptoms ***  Psychosis Symptoms ***  Past Psychiatric History:  Diagnoses: *** Previous medications: was on Zyprexa during the hospitalization but d/c in the hospital and in the note states psychosis resolved Previous psychiatrist/therapist: ***  Hospitalizations: 10/2022 for 6 days of substance induced psychosis Suicide attempts: *** SIB: *** Current access to guns: ***  Hx of violence towards others: ***  Hx of trauma/abuse: ***  Substance use:  Tobacco: *** Alcohol: *** Marijuana: *** Other illicit substances: ***  Past Medical History:  Past Medical History:  Diagnosis Date   ADD (attention deficit disorder)    No past surgical history on file.  Family History: No family history on file.  Family Psychiatric History: ***  Social History:  Living:  *** Occupation: *** Relationship: *** Children: *** Support: *** Legal History: ***  Allergies:  Allergies  Allergen Reactions   Shellfish Allergy     Current Medications: Current Outpatient Medications  Medication Sig Dispense Refill   celecoxib  (CELEBREX ) 200 MG capsule Take 1 capsule (200 mg total) by mouth 2 (two) times daily. 20 capsule 0   cyclobenzaprine  (FLEXERIL ) 10 MG tablet Take 0.5-1 tablets (5-10 mg total) by mouth 2 (two) times daily as needed for muscle spasms. 20 tablet 0   hydrOXYzine  (ATARAX /VISTARIL ) 25 MG tablet Take 1 tablet (25 mg total) by mouth every 6 (six) hours. 12 tablet 0   methylPREDNISolone  (MEDROL  DOSEPAK) 4 MG TBPK tablet Use as directed 21 tablet 0   ondansetron  (ZOFRAN ) 4 MG tablet Take 1 tablet (4 mg total) by mouth every 6 (six) hours. 12 tablet 0   No current facility-administered medications for this visit.    Objective:  Psychiatric Specialty Exam General Appearance: appears at stated age, casually dressed and groomed ***  Behavior: pleasant and cooperative ***  Psychomotor Activity: no psychomotor agitation or retardation noted ***  Eye Contact: fair *** Speech: normal amount, tone, volume and fluency ***   Mood: euthymic *** Affect: congruent, pleasant and interactive ***  Thought Process: linear, goal directed, no circumstantial or tangential thought process noted, no racing thoughts or flight of ideas *** Descriptions of Associations: intact ***  Thought Content Hallucinations: denies AH, VH , does not appear responding to stimuli *** Delusions: no paranoia, delusions of control, grandeur, ideas of reference, thought broadcasting, and magical thinking *** Suicidal Thoughts: denies  SI, intention, plan *** Homicidal Thoughts: denies HI, intention, plan ***  Alertness/Orientation: alert and fully oriented ***  Insight: fair*** Judgment: fair***  Memory: intact ***  Executive Functions  Concentration: intact  *** Attention Span: fair *** Recall: intact *** Fund of Knowledge: fair ***  Physical Exam *** General: Pleasant, well-appearing ***. No acute distress. Pulmonary: Normal effort. No wheezing or rales. Skin: No obvious rash or lesions. Neuro: A&Ox3.No focal deficit.   Review of Systems *** No reported symptoms  Metabolic Disorder Labs: No results found for: HGBA1C, MPG No results found for: PROLACTIN No results found for: CHOL, TRIG, HDL, CHOLHDL, VLDL, LDLCALC No results found for: TSH  Therapeutic Level Labs: No results found for: LITHIUM No results found for: CBMZ No results found for: VALPROATE  Screenings:  Flowsheet Row ED from 03/01/2024 in [facility] Emergency Department at [facility] ED from 01/20/2022 in [facility] Emergency Department at [facility] ED from 02/18/2021 in [facility] Emergency Department at [facility]  C-SSRS RISK CATEGORY No Risk No Risk Low Risk    Collaboration of Care: Case discussed with attending, see attending's attestation for additional information.  Ismael Franco, MD PGY-3 Psychiatry Resident

## 2024-06-19 ENCOUNTER — Ambulatory Visit (HOSPITAL_COMMUNITY): Payer: MEDICAID | Admitting: Psychiatry

## 2024-06-19 ENCOUNTER — Encounter (HOSPITAL_COMMUNITY): Payer: Self-pay
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
## 2024-07-03 ENCOUNTER — Ambulatory Visit (HOSPITAL_COMMUNITY): Payer: MEDICAID

## 2024-07-03 ENCOUNTER — Other Ambulatory Visit: Payer: Self-pay

## 2024-07-03 ENCOUNTER — Ambulatory Visit (HOSPITAL_BASED_OUTPATIENT_CLINIC_OR_DEPARTMENT_OTHER): Payer: MEDICAID | Admitting: Psychiatry

## 2024-07-03 VITALS — BP 115/77 | Ht 67.0 in | Wt 141.2 lb

## 2024-07-03 DIAGNOSIS — Z5181 Encounter for therapeutic drug level monitoring: Secondary | ICD-10-CM

## 2024-07-03 DIAGNOSIS — F19959 Other psychoactive substance use, unspecified with psychoactive substance-induced psychotic disorder, unspecified: Secondary | ICD-10-CM | POA: Insufficient documentation

## 2024-07-03 DIAGNOSIS — F411 Generalized anxiety disorder: Secondary | ICD-10-CM | POA: Insufficient documentation

## 2024-07-03 DIAGNOSIS — F331 Major depressive disorder, recurrent, moderate: Secondary | ICD-10-CM | POA: Diagnosis not present

## 2024-07-03 MED ORDER — SERTRALINE HCL 25 MG PO TABS
25.0000 mg | ORAL_TABLET | Freq: Every day | ORAL | 0 refills | Status: AC
  Filled 2024-07-03 (×2): qty 60, 60d supply, fill #0

## 2024-07-03 MED ORDER — LAMOTRIGINE 25 MG PO TABS
50.0000 mg | ORAL_TABLET | Freq: Every day | ORAL | 1 refills | Status: AC
  Filled 2024-07-03: qty 60, 30d supply, fill #0

## 2024-07-03 MED ORDER — RISPERIDONE 2 MG PO TABS
2.0000 mg | ORAL_TABLET | Freq: Every day | ORAL | 0 refills | Status: AC
  Filled 2024-07-03: qty 60, 60d supply, fill #0

## 2024-07-03 MED ORDER — RISPERIDONE 1 MG PO TABS
ORAL_TABLET | ORAL | 0 refills | Status: DC
  Filled 2024-07-03: qty 7, 7d supply, fill #0

## 2024-07-03 NOTE — Patient Instructions (Addendum)
 Thank you for attending your appointment today.  -- start Zoloft  25 mg daily -- decrease Risperdal  2 mg to 1 mg for 1 week and then if tolerating, decrease Risperdal  to 0.5 mg nightly -- continue Lamictal   PICK UP YOUR MEDICATIONS AT [address], [place], [place] [address]  Please do not make any changes to medications without first discussing with your provider. If you are experiencing a psychiatric emergency, please call 911 or present to your nearest emergency department. Additional crisis, medication management, and therapy resources are included below.  [facility]  [address] [place], [place] [address] [phone] WALK-IN URGENT CARE 24/7 FOR ANYONE [address], [place], [place]  [phone] Fax: [phone] [email or web address] *Interpreters available *Accepts all insurance and uninsured for Urgent Care needs *Accepts Medicaid and uninsured for outpatient treatment (below)      ONLY FOR [facility]  Below:    Outpatient New Patient Assessment/Therapy Walk-ins:        Monday, Wednesday, and Thursday 8am until slots are full (first come, first served)                   New Patient Psychiatry/Medication Management        Monday-Friday 8am-11am (first come, first served)               For all walk-ins we ask that you arrive by 7:15am, because patients will be seen in the order of arrival.

## 2024-07-03 NOTE — Addendum Note (Signed)
 Addended by: CARVIN CROCK on: 07/03/2024 11:35 AM   Modules accepted: Level of Service

## 2024-07-03 NOTE — Addendum Note (Signed)
 Addended by: IZELLA CARAWAY B on: 07/03/2024 11:33 AM   Modules accepted: Orders

## 2024-07-04 ENCOUNTER — Telehealth (HOSPITAL_COMMUNITY): Payer: Self-pay | Admitting: Psychiatry

## 2024-07-04 NOTE — Telephone Encounter (Signed)
 Attempted to contact patient twice regarding his Risperdal  but no answer.

## 2024-07-05 LAB — LIPID PANEL
Chol/HDL Ratio: 4.6 ratio (ref 0.0–5.0)
Cholesterol, Total: 199 mg/dL (ref 100–199)
HDL: 43 mg/dL (ref >39)
LDL Chol Calc (NIH): 131 mg/dL — ABNORMAL HIGH (ref 0–99)
Triglycerides: 140 mg/dL (ref 0–149)
VLDL Cholesterol Cal: 25 mg/dL (ref 5–40)

## 2024-07-05 LAB — HEMOGLOBIN A1C
Est. average glucose Bld gHb Est-mCnc: 91 mg/dL
Hgb A1c MFr Bld: 4.8 % (ref 4.8–5.6)

## 2024-07-05 LAB — TSH: TSH: 1.55 u[IU]/mL (ref 0.450–4.500)

## 2024-07-07 ENCOUNTER — Other Ambulatory Visit: Payer: Self-pay

## 2024-07-10 ENCOUNTER — Other Ambulatory Visit: Payer: Self-pay
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
## 2024-08-07 ENCOUNTER — Telehealth (HOSPITAL_COMMUNITY): Payer: Self-pay | Admitting: Psychiatry

## 2024-08-07 ENCOUNTER — Ambulatory Visit (HOSPITAL_COMMUNITY): Payer: MEDICAID | Admitting: Psychiatry

## 2024-08-07 NOTE — Telephone Encounter (Signed)
 Patient did not show up for the appointment.  Attempted to call the patient but received no response. Left a HIPAA compliant voicemail for patient to reschedule.   Ismael Franco, MD PGY-3 Psychiatry Resident
